# Patient Record
Sex: Male | Born: 1985 | Race: White | Hispanic: No | Marital: Single | Smoking: Current every day smoker
Health system: Southern US, Community
[De-identification: ages and names within clinical notes are randomized; demographics above are authoritative.]

---

## 2015-03-11 ENCOUNTER — Encounter: Payer: Self-pay | Admitting: Emergency Medicine

## 2015-03-11 ENCOUNTER — Emergency Department
Admission: EM | Admit: 2015-03-11 | Discharge: 2015-03-11 | Disposition: A | Discharge status: Home / Self Care | Payer: BLUE CROSS/BLUE SHIELD | Attending: Emergency Medicine | Admitting: Emergency Medicine

## 2015-03-11 DIAGNOSIS — F17200 Nicotine dependence, unspecified, uncomplicated: Secondary | ICD-10-CM | POA: Diagnosis not present

## 2015-03-11 DIAGNOSIS — R112 Nausea with vomiting, unspecified: Secondary | ICD-10-CM | POA: Diagnosis not present

## 2015-03-11 LAB — CBC
HCT: 48.8 % (ref 40.0–52.0)
Hemoglobin: 16.6 g/dL (ref 13.0–18.0)
MCH: 33 pg (ref 26.0–34.0)
MCHC: 34 g/dL (ref 32.0–36.0)
MCV: 97.2 fL (ref 80.0–100.0)
PLATELETS: 234 10*3/uL (ref 150–440)
RBC: 5.02 MIL/uL (ref 4.40–5.90)
RDW: 13.1 % (ref 11.5–14.5)
WBC: 7.9 10*3/uL (ref 3.8–10.6)

## 2015-03-11 LAB — COMPREHENSIVE METABOLIC PANEL
ALBUMIN: 5.2 g/dL — AB (ref 3.5–5.0)
ALK PHOS: 62 U/L (ref 38–126)
ALT: 26 U/L (ref 17–63)
AST: 35 U/L (ref 15–41)
Anion gap: 9 (ref 5–15)
BILIRUBIN TOTAL: 0.8 mg/dL (ref 0.3–1.2)
BUN: 10 mg/dL (ref 6–20)
CO2: 25 mmol/L (ref 22–32)
CREATININE: 0.7 mg/dL (ref 0.61–1.24)
Calcium: 9.9 mg/dL (ref 8.9–10.3)
Chloride: 105 mmol/L (ref 101–111)
GFR calc Af Amer: >60 mL/min (ref >60)
GLUCOSE: 113 mg/dL — AB (ref 65–99)
Potassium: 3.8 mmol/L (ref 3.5–5.1)
Sodium: 139 mmol/L (ref 135–145)
TOTAL PROTEIN: 8.6 g/dL — AB (ref 6.5–8.1)

## 2015-03-11 LAB — LIPASE, BLOOD: Lipase: 21 U/L (ref 11–51)

## 2015-03-11 MED ORDER — ONDANSETRON 4 MG PO TBDP: 4.0000 mg | ORAL_TABLET | Freq: Three times a day (TID) | ORAL | Status: AC | PRN

## 2015-03-11 NOTE — Discharge Instructions (Signed)

## 2015-03-11 NOTE — ED Provider Notes (Signed)
[facility] Emergency Department Provider Note  ____________________________________________  Time seen: 5:15 PM  I have reviewed the triage vital signs and the nursing notes.   HISTORY  Chief Complaint Emesis    HPI Ronnie Li is a 30 y.o. male who is in his usual state of health when he went to bed last night. He woke up this morning with nausea vomiting multiple episodes. After vomiting he felt much better. He now feels like his symptoms are completely resolved. However he called out of work and is asked told him that he needs a work. He is comfortable work note. He denies any symptoms at present time and feels much better.Thinks that something he ate last night.     History reviewed. No pertinent past medical history.   There are no active problems to display for this patient.    History reviewed. No pertinent past surgical history.   Current Outpatient Rx  Name  Route  Sig  Dispense  Refill  . ondansetron (ZOFRAN ODT) 4 MG disintegrating tablet   Oral   Take 1 tablet (4 mg total) by mouth every 8 (eight) hours as needed for nausea or vomiting.   20 tablet   0      Allergies Review of patient's allergies indicates no known allergies.   No family history on file.  Social History Social History  Substance Use Topics  . Smoking status: Current Every Day Smoker  . Smokeless tobacco: None  . Alcohol Use: Yes    Review of Systems  Constitutional:   No fever or chills. No weight changes Eyes:   No blurry vision or double vision.  ENT:   No sore throat.  Cardiovascular:   No chest pain. Respiratory:   No dyspnea or cough. Gastrointestinal:   Negative for abdominal pain, positive vomiting this morning now resolved no diarrhea.  No BRBPR or melena. Genitourinary:   Negative for dysuria or difficulty urinating. Musculoskeletal:   Negative for back pain. No joint swelling or pain. Skin:   Negative for rash. Neurological:    Negative for headaches, focal weakness or numbness. Psychiatric:  No anxiety or depression.   Endocrine:  No changes in energy or sleep difficulty.  10-point ROS otherwise negative.  ____________________________________________   PHYSICAL EXAM:  VITAL SIGNS: ED Triage Vitals  Enc Vitals Group     BP 03/11/15 1525 147/94 mmHg     Pulse Rate 03/11/15 1525 89     Resp 03/11/15 1525 16     Temp 03/11/15 1525 98.8 F (37.1 C)     Temp src --      SpO2 03/11/15 1525 97 %     Weight 03/11/15 1525 190 lb (86.183 kg)     Height 03/11/15 1525 5\' 9"  (1.753 m)     Head Cir --      Peak Flow --      Pain Score 03/11/15 1526 3     Pain Loc --      Pain Edu? --      Excl. in GC? --     Vital signs reviewed, nursing assessments reviewed.   Constitutional:   Alert and oriented. Well appearing and in no distress. Eyes:   No scleral icterus. No conjunctival pallor. PERRL. EOMI ENT   Head:   Normocephalic and atraumatic.   Nose:   No congestion/rhinnorhea. No septal hematoma   Mouth/Throat:   MMM, no pharyngeal erythema. No peritonsillar mass.    Neck:   No  stridor. No SubQ emphysema. No meningismus. Hematological/Lymphatic/Immunilogical:   No cervical lymphadenopathy. Cardiovascular:   RRR. Symmetric bilateral radial and DP pulses.  No murmurs.  Respiratory:   Normal respiratory effort without tachypnea nor retractions. Breath sounds are clear and equal bilaterally. No wheezes/rales/rhonchi. Gastrointestinal:   Soft and nontender. Non distended. There is no CVA tenderness.  No rebound, rigidity, or guarding. Genitourinary:   deferred Musculoskeletal:   Nontender with normal range of motion in all extremities. No joint effusions.  No lower extremity tenderness.  No edema. Neurologic:   Normal speech and language.  CN 2-10 normal. Motor grossly intact. No gross focal neurologic deficits are appreciated.  Skin:    Skin is warm, dry and intact. No rash noted.  No  petechiae, purpura, or bullae. Psychiatric:   Mood and affect are normal. ____________________________________________    LABS (pertinent positives/negatives) (all labs ordered are listed, but only abnormal results are displayed) Labs Reviewed  COMPREHENSIVE METABOLIC PANEL - Abnormal; Notable for the following:    Glucose, Bld 113 (*)    Total Protein 8.6 (*)    Albumin 5.2 (*)    All other components within normal limits  LIPASE, BLOOD  CBC  URINALYSIS COMPLETEWITH MICROSCOPIC ([facility] ONLY)   ____________________________________________   EKG    ____________________________________________    RADIOLOGY    ____________________________________________   PROCEDURES   ____________________________________________   INITIAL IMPRESSION / ASSESSMENT AND PLAN / ED COURSE  Pertinent labs & imaging results that were available during my care of the patient were reviewed by me and considered in my medical decision making (see chart for details).  Patient well appearing no acute distress. Currently asymptomatic. Exam is unremarkable. Labs unremarkable. Vital signs are normal. We'll discharge home, prescription for Zofran as needed. Work note provided     ____________________________________________   FINAL CLINICAL IMPRESSION(S) / ED DIAGNOSES  Final diagnoses:  Non-intractable vomiting with nausea, vomiting of unspecified type      Sharman Cheek, MD 03/11/15 1734

## 2015-03-11 NOTE — ED Notes (Signed)
Pt reports vomiting/diarrhea since last night; pt denies abdominal pain. Pt states "I had to call out of work today so I had to come up here to get a note". Pt alert and oriented in triage, ambulatory to triage room.

## 2016-03-01 ENCOUNTER — Emergency Department
Admission: EM | Admit: 2016-03-01 | Discharge: 2016-03-01 | Disposition: A | Discharge status: Home / Self Care | Payer: BLUE CROSS/BLUE SHIELD | Attending: Emergency Medicine | Admitting: Emergency Medicine

## 2016-03-01 ENCOUNTER — Emergency Department: Payer: BLUE CROSS/BLUE SHIELD

## 2016-03-01 DIAGNOSIS — M25561 Pain in right knee: Secondary | ICD-10-CM

## 2016-03-01 DIAGNOSIS — F17200 Nicotine dependence, unspecified, uncomplicated: Secondary | ICD-10-CM | POA: Insufficient documentation

## 2016-03-01 DIAGNOSIS — M7631 Iliotibial band syndrome, right leg: Secondary | ICD-10-CM | POA: Diagnosis not present

## 2016-03-01 DIAGNOSIS — G8929 Other chronic pain: Secondary | ICD-10-CM | POA: Insufficient documentation

## 2016-03-01 MED ORDER — DICLOFENAC SODIUM 75 MG PO TBEC: 75.0000 mg | DELAYED_RELEASE_TABLET | Freq: Two times a day (BID) | ORAL | 1 refills | Status: AC

## 2016-03-01 MED ORDER — CYCLOBENZAPRINE HCL 5 MG PO TABS: 5.0000 mg | ORAL_TABLET | Freq: Three times a day (TID) | ORAL | 0 refills | Status: AC | PRN

## 2016-03-01 NOTE — ED Notes (Signed)
See triage note  States he developed right knee pain about 7 months ago denied injury to knee but states pain was there upon awakening.  Has had intermittent pain with swelling to same knee since. ambulates well to treatment room

## 2016-03-01 NOTE — ED Triage Notes (Signed)
Pt states R knee pain, states he walks around and stands on it a lot. Pain began 7 months ago. Denies injury.

## 2016-03-01 NOTE — Discharge Instructions (Signed)
Your knee exam and x-ray are negative for any signs of internal derangement, like a meniscus tear. You appear to have some tendinitis to the iliotibial band and the lateral (outside) of the knee joint. You should take the prescription meds as directed. Consider using a neoprene knee sleeve for support. Rest and ice the joint for comfort. Follow-up with Dr. Rosita KeaMenz for ongoing symptoms.

## 2016-03-02 NOTE — ED Provider Notes (Signed)
[facility] Emergency Department Provider Note ____________________________________________  Time seen: 1542  I have reviewed the triage vital signs and the nursing notes.  HISTORY  Chief Complaint  Knee Pain  HPI Ronnie Li is a 31 y.o. male resistance to the ED with complaints of right knee pain that is aggravated by prolonged walking and standing. Pain began about 7 months prior with patient denies any injury, accident, trauma preceding his knee pain. He works as a Production designer, theatre/television/film at [employer], but denies any change in his activities day today. He reports tightness and stiffness to the knee that he localizes primarily to the lateral aspect of the right knee. He has not taken any medications in the interim for symptom management. He denies any history of ongoing or chronic knee pain.  History reviewed. No pertinent past medical history.  There are no active problems to display for this patient.  History reviewed. No pertinent surgical history.  Prior to Admission medications   Medication Sig Start Date End Date Taking? Authorizing Provider  cyclobenzaprine (FLEXERIL) 5 MG tablet Take 1 tablet (5 mg total) by mouth 3 (three) times daily as needed for muscle spasms. 03/01/16   Flo Berroa V Bacon Celene Pippins, PA-C  diclofenac (VOLTAREN) 75 MG EC tablet Take 1 tablet (75 mg total) by mouth 2 (two) times daily. 03/01/16   Tamekia Rotter V Bacon Brailynn Breth, PA-C  ondansetron (ZOFRAN ODT) 4 MG disintegrating tablet Take 1 tablet (4 mg total) by mouth every 8 (eight) hours as needed for nausea or vomiting. 03/11/15   Sharman Cheek, MD    Allergies Patient has no known allergies.  History reviewed. No pertinent family history.  Social History Social History  Substance Use Topics  . Smoking status: Current Every Day Smoker  . Smokeless tobacco: Not on file  . Alcohol use Yes    Review of Systems  Constitutional: Negative for fever. Cardiovascular: Negative  for chest pain. Respiratory: Negative for shortness of breath. Musculoskeletal: Negative for back pain. Right lateral knee pain as above. Skin: Negative for rash. Neurological: Negative for headaches, focal weakness or numbness. ____________________________________________  PHYSICAL EXAM:  VITAL SIGNS: ED Triage Vitals  Enc Vitals Group     BP 03/01/16 1504 (!) 179/83     Pulse Rate 03/01/16 1504 75     Resp 03/01/16 1504 18     Temp 03/01/16 1504 99.1 F (37.3 C)     Temp Source 03/01/16 1504 Oral     SpO2 03/01/16 1504 100 %     Weight --      Height --      Head Circumference --      Peak Flow --      Pain Score 03/01/16 1505 2     Pain Loc --      Pain Edu? --      Excl. in GC? --     Constitutional: Alert and oriented. Well appearing and in no distress. Head: Normocephalic and atraumatic. Cardiovascular: Normal rate, regular rhythm. Normal distal pulses. Respiratory: Normal respiratory effort. No wheezes/rales/rhonchi. Musculoskeletal: Right knee without any obvious deformity, effusion, or dislocation. Patient with normal knee tracking on exam. He does cough his range of motion with flexion noting pain to the lateral aspect of the knee. No valgus or varus stress stresses appreciated. No calf or Achilles tenderness is noted. No popliteal fullness is appreciated. Negative drawer sign. Nontender with normal range of motion in all extremities.  Neurologic:  Normal gait without ataxia. Normal  speech and language. No gross focal neurologic deficits are appreciated. Skin:  Skin is warm, dry and intact. No rash noted. ____________________________________________   RADIOLOGY Right Knee  IMPRESSION: Negative. ____________________________________________  INITIAL IMPRESSION / ASSESSMENT AND PLAN / ED COURSE  Patient with chronic knee pain with symptoms persisting for more than 7 months. No radiologic evidence of acute fracture, dislocation or degenerative joint disease. The  patient does have some mild lateral patellar spurring that may be underlying causes him his discomfort. He is advised this time to follow with orthopedics for ongoing and more definitive management. He is also advised he might benefit from a neoprene sleeve while working.  Prescriptions for Voltaren and cyclobenzaprine provided for his pain relief and muscle pain relief, respectively. ____________________________________________  FINAL CLINICAL IMPRESSION(S) / ED DIAGNOSES  Final diagnoses:  Chronic pain of right knee  Iliotibial band syndrome of right side      Lissa HoardJenise V Bacon Takota Cahalan, PA-C 03/02/16 0021    Rockne MenghiniAnne-Caroline Norman, MD 03/06/16 1523

## 2018-08-18 IMAGING — CR DG KNEE COMPLETE 4+V*R*
1 series · 4 of 4 positions shown · non-contrast
Comparison: None.

CLINICAL DATA: Right knee pain for several months, no acute injury

EXAM:
RIGHT KNEE - COMPLETE 4+ VIEW

[Series 1: dg knee complete 4 views right · 0.14mm/px · 4 of 4 slices shown]
[im 1/4]
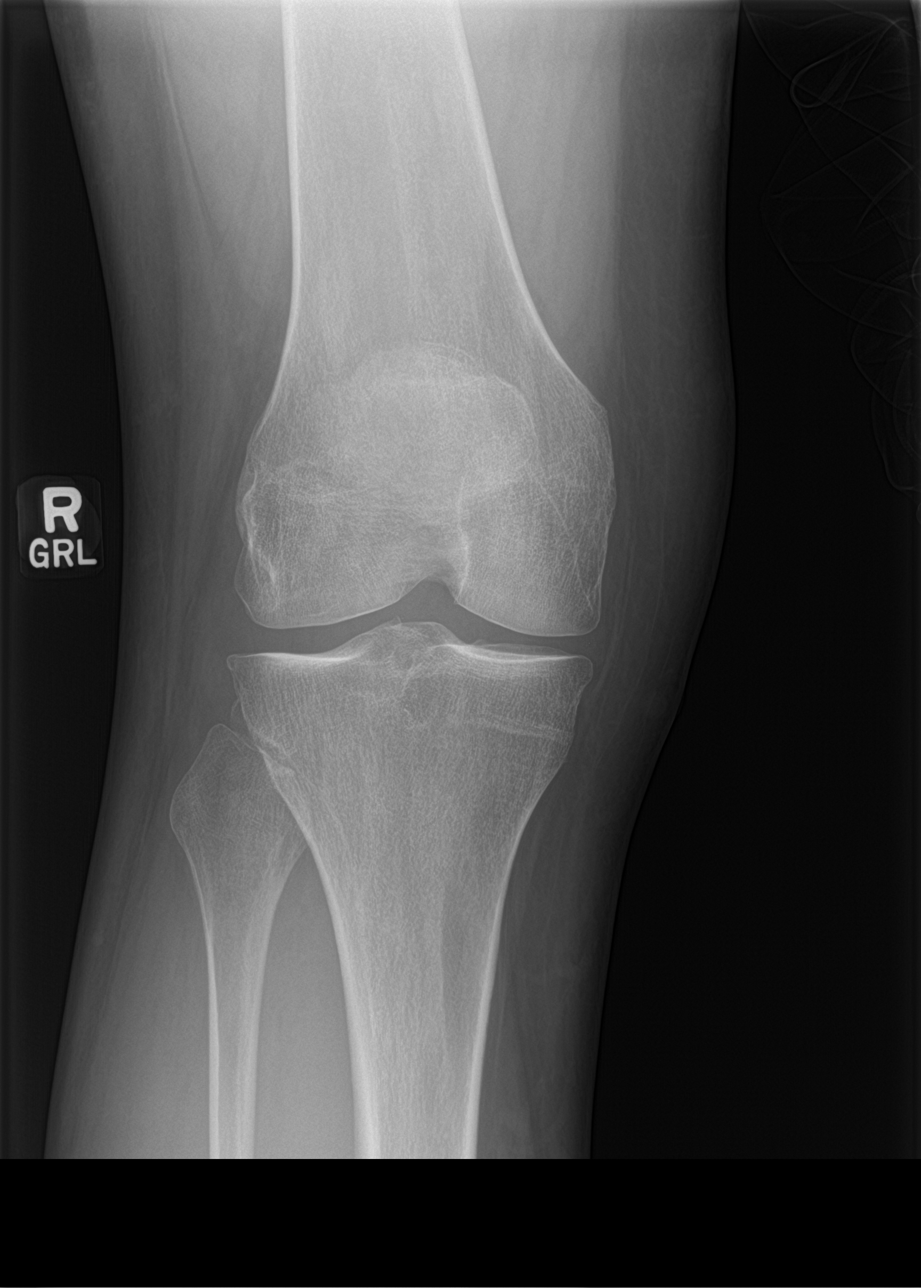
[im 2/4]
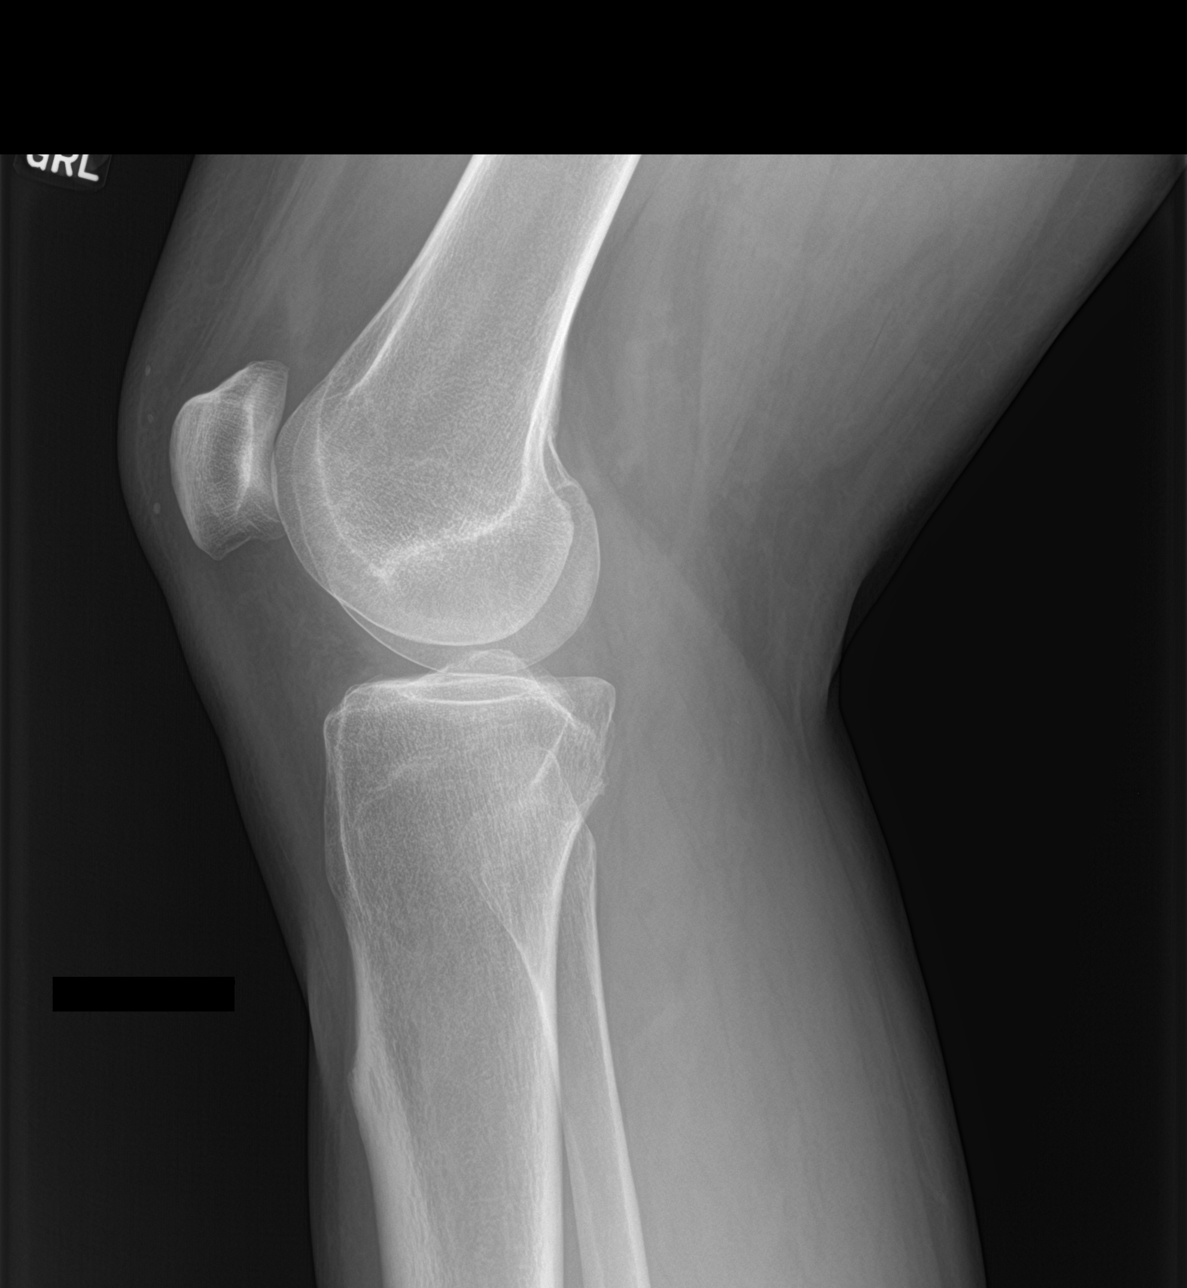
[im 3/4]
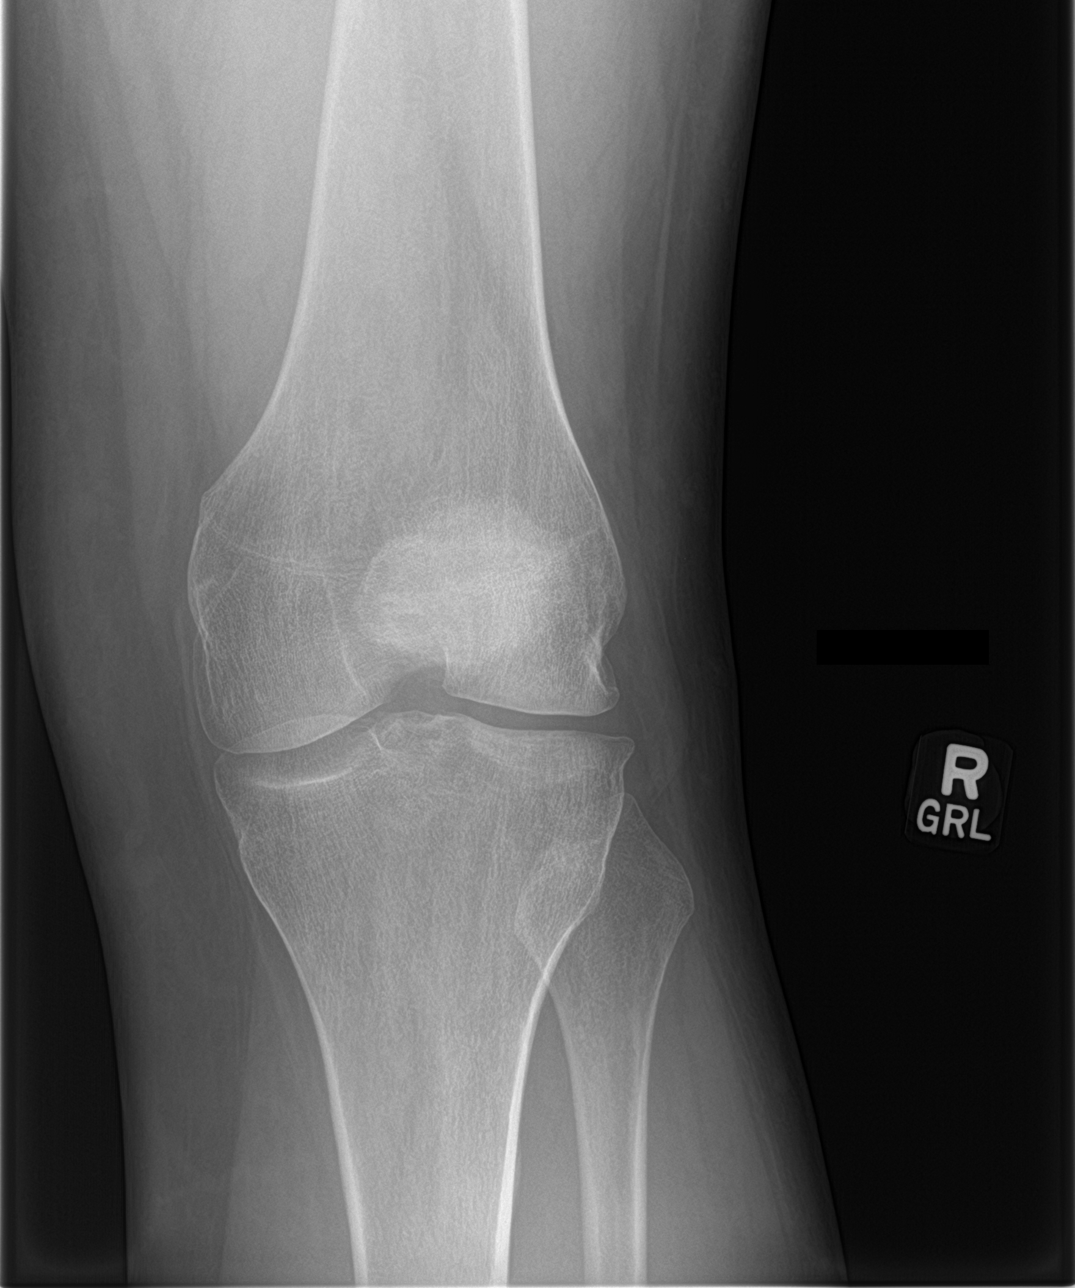
[im 4/4]
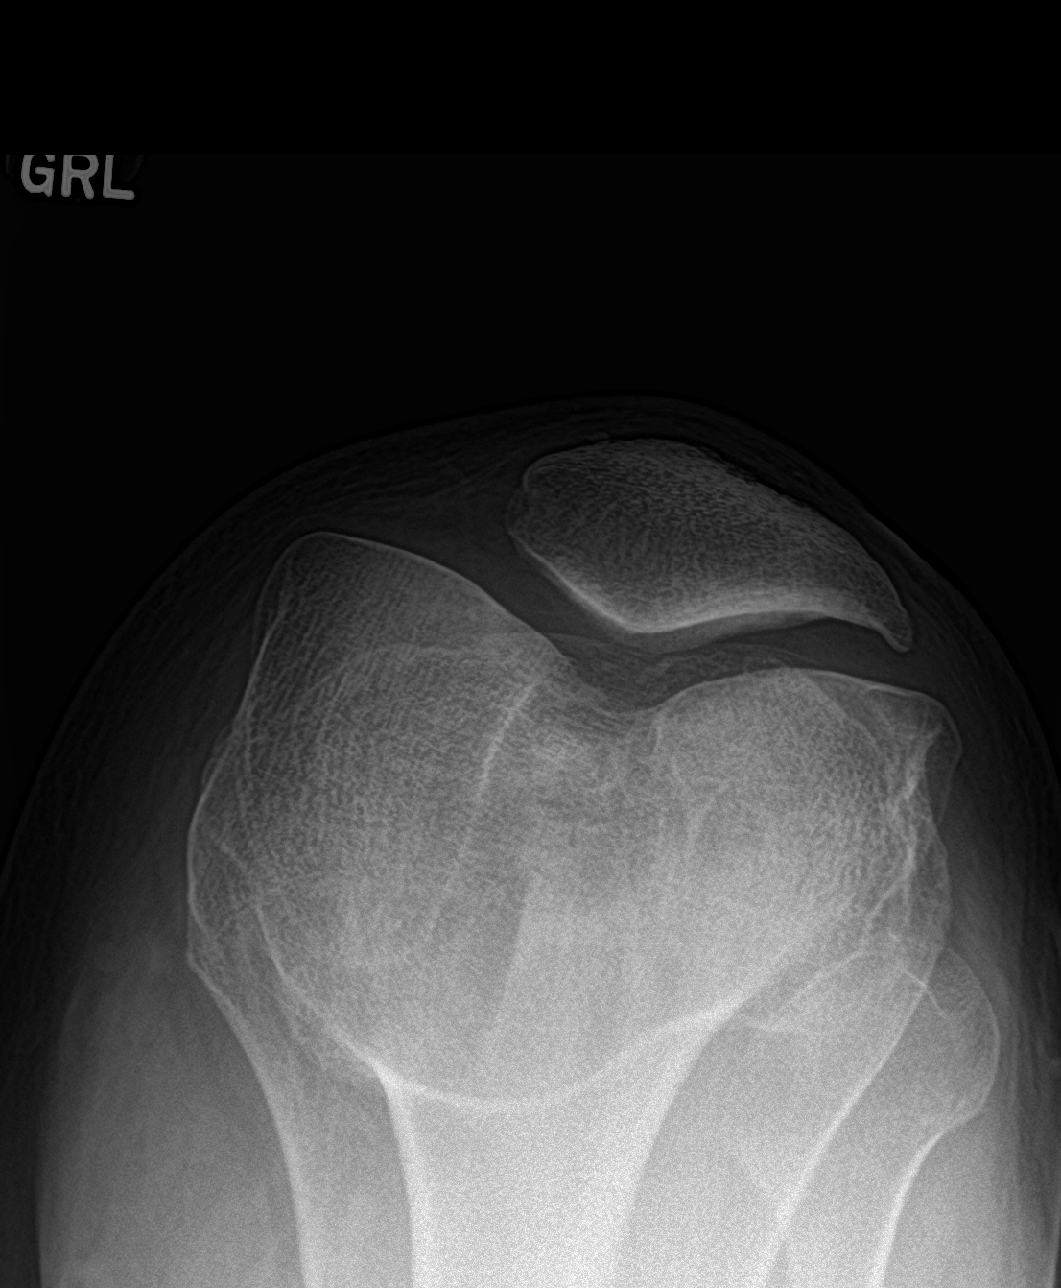

[4 of 4 positions shown; findings below may reference images not displayed]

FINDINGS: The joint spaces are relatively well preserved for age. No fracture
is seen. No joint effusion is noted.
IMPRESSION: Negative.

## 2019-03-29 ENCOUNTER — Ambulatory Visit: Payer: Self-pay | Attending: Internal Medicine
